# Patient Record
Sex: Female | Born: 1995 | Hispanic: Yes | Marital: Single | Smoking: Current every day smoker
Health system: Southern US, Community
[De-identification: ages and names within clinical notes are randomized; demographics above are authoritative.]

## PROBLEM LIST (undated history)

## (undated) DIAGNOSIS — I10 Essential (primary) hypertension: Secondary | ICD-10-CM

---

## 2016-05-11 ENCOUNTER — Emergency Department (HOSPITAL_COMMUNITY)
Admission: EM | Admit: 2016-05-11 | Discharge: 2016-05-11 | Disposition: A | Discharge status: Home / Self Care | Attending: Dermatology | Admitting: Dermatology

## 2016-05-11 ENCOUNTER — Encounter (HOSPITAL_COMMUNITY): Payer: Self-pay | Admitting: Emergency Medicine

## 2016-05-11 DIAGNOSIS — Z5321 Procedure and treatment not carried out due to patient leaving prior to being seen by health care provider: Secondary | ICD-10-CM | POA: Diagnosis not present

## 2016-05-11 DIAGNOSIS — I10 Essential (primary) hypertension: Secondary | ICD-10-CM | POA: Diagnosis present

## 2016-05-11 HISTORY — DX: Essential (primary) hypertension: I10

## 2016-05-11 NOTE — ED Triage Notes (Addendum)
Per verbalizes flu like symptoms onset 4 days ago; Theraflu taken this morning. Pt continues to verbalizes worsening hypertension. Pt verbalizes hx of hypertension and has not taken medications for such in some time. Neuro unremarkable.

## 2016-05-11 NOTE — ED Notes (Signed)
Patient handed registration pt labels and notified she was leaving without being seen.

## 2016-05-12 ENCOUNTER — Emergency Department (HOSPITAL_COMMUNITY)
Admission: EM | Admit: 2016-05-12 | Discharge: 2016-05-12 | Disposition: A | Discharge status: Home / Self Care | Attending: Emergency Medicine | Admitting: Emergency Medicine

## 2016-05-12 ENCOUNTER — Emergency Department (HOSPITAL_COMMUNITY)

## 2016-05-12 ENCOUNTER — Encounter (HOSPITAL_COMMUNITY): Payer: Self-pay | Admitting: Emergency Medicine

## 2016-05-12 DIAGNOSIS — I10 Essential (primary) hypertension: Secondary | ICD-10-CM | POA: Diagnosis not present

## 2016-05-12 DIAGNOSIS — R079 Chest pain, unspecified: Secondary | ICD-10-CM | POA: Insufficient documentation

## 2016-05-12 DIAGNOSIS — F17210 Nicotine dependence, cigarettes, uncomplicated: Secondary | ICD-10-CM | POA: Insufficient documentation

## 2016-05-12 DIAGNOSIS — Z79899 Other long term (current) drug therapy: Secondary | ICD-10-CM | POA: Insufficient documentation

## 2016-05-12 DIAGNOSIS — R51 Headache: Secondary | ICD-10-CM | POA: Diagnosis present

## 2016-05-12 DIAGNOSIS — G43809 Other migraine, not intractable, without status migrainosus: Secondary | ICD-10-CM | POA: Insufficient documentation

## 2016-05-12 LAB — CBC
HEMATOCRIT: 43.5 % (ref 36.0–46.0)
Hemoglobin: 14.6 g/dL (ref 12.0–15.0)
MCH: 27.5 pg (ref 26.0–34.0)
MCHC: 33.6 g/dL (ref 30.0–36.0)
MCV: 81.9 fL (ref 78.0–100.0)
PLATELETS: 290 10*3/uL (ref 150–400)
RBC: 5.31 MIL/uL — AB (ref 3.87–5.11)
RDW: 14 % (ref 11.5–15.5)
WBC: 9.3 10*3/uL (ref 4.0–10.5)

## 2016-05-12 LAB — BASIC METABOLIC PANEL
Anion gap: 13 (ref 5–15)
BUN: 10 mg/dL (ref 6–20)
CHLORIDE: 101 mmol/L (ref 101–111)
CO2: 23 mmol/L (ref 22–32)
Calcium: 9.7 mg/dL (ref 8.9–10.3)
Creatinine, Ser: 0.73 mg/dL (ref 0.44–1.00)
Glucose, Bld: 91 mg/dL (ref 65–99)
POTASSIUM: 3.9 mmol/L (ref 3.5–5.1)
SODIUM: 137 mmol/L (ref 135–145)

## 2016-05-12 LAB — I-STAT TROPONIN, ED
TROPONIN I, POC: 0 ng/mL (ref 0.00–0.08)
Troponin i, poc: 0.01 ng/mL (ref 0.00–0.08)

## 2016-05-12 MED ORDER — DEXAMETHASONE SODIUM PHOSPHATE 10 MG/ML IJ SOLN
10.0000 mg | Freq: Once | INTRAMUSCULAR | Status: AC
  Administered 2016-05-12: 10 mg via INTRAVENOUS
  Filled 2016-05-12: qty 1

## 2016-05-12 MED ORDER — DIPHENHYDRAMINE HCL 50 MG/ML IJ SOLN
25.0000 mg | Freq: Once | INTRAMUSCULAR | Status: AC
  Administered 2016-05-12: 25 mg via INTRAVENOUS
  Filled 2016-05-12: qty 1

## 2016-05-12 MED ORDER — AMLODIPINE BESYLATE 10 MG PO TABS: 10.0000 mg | ORAL_TABLET | Freq: Every day | ORAL | 0 refills | Status: AC

## 2016-05-12 MED ORDER — METOCLOPRAMIDE HCL 5 MG/ML IJ SOLN
10.0000 mg | Freq: Once | INTRAMUSCULAR | Status: AC
  Administered 2016-05-12: 10 mg via INTRAVENOUS
  Filled 2016-05-12: qty 2

## 2016-05-12 MED ORDER — AMLODIPINE BESYLATE 5 MG PO TABS
10.0000 mg | ORAL_TABLET | Freq: Once | ORAL | Status: AC
  Administered 2016-05-12: 10 mg via ORAL
  Filled 2016-05-12: qty 2

## 2016-05-12 NOTE — ED Triage Notes (Signed)
Pt reports hypertensive crisis, Hx HTN, no meds at presents due to lack of PCP. Alert and oriented x4. Also reports chest pain radiating to left arm and neck, tingling to left fingers.

## 2016-05-12 NOTE — ED Notes (Signed)
Patient is A & O x4.  Patient understood discharge instructions. 

## 2016-05-12 NOTE — ED Provider Notes (Signed)
[facility]-EMERGENCY DEPT Provider Note   CSN: [id number] Arrival date & time: 05/12/16  4098     History   Chief Complaint Chief Complaint  Patient presents with  . Chest Pain    HPI Tracy French is a 21 y.o. female.  The history is provided by the patient.  Chest Pain   Associated symptoms include headaches and nausea. Pertinent negatives include no fever and no vomiting.  Headache   This is a new problem. The current episode started 6 to 12 hours ago. The problem occurs constantly. The problem has not changed since onset.The headache is associated with nothing. The pain is located in the frontal region. The quality of the pain is described as dull. The pain is moderate. The pain does not radiate. Associated symptoms include nausea. Pertinent negatives include no fever and no vomiting. She has tried nothing for the symptoms. The treatment provided no relief.    Past Medical History:  Diagnosis Date  . Hypertension     There are no active problems to display for this patient.   History reviewed. No pertinent surgical history.  OB History    No data available       Home Medications    Prior to Admission medications   Medication Sig Start Date End Date Taking? Authorizing Provider  Phenylephrine-DM (THERAFLU COLD/COUGH DAYTIME) 10-20 MG STRP Take 1 tablet by mouth daily as needed (cold).   Yes Historical Provider, MD    Family History No family history on file.  Social History Social History  Substance Use Topics  . Smoking status: Current Every Day Smoker    Packs/day: 0.25    Types: Cigarettes  . Smokeless tobacco: Not on file  . Alcohol use Yes     Allergies   Patient has no known allergies.   Review of Systems Review of Systems  Constitutional: Negative for fever.  Cardiovascular: Positive for chest pain.  Gastrointestinal: Positive for nausea. Negative for vomiting.  Neurological: Positive for headaches.  All other systems reviewed and are  negative.    Physical Exam Updated Vital Signs BP (!) 160/129   Pulse 114   Temp 97.8 F (36.6 C) (Oral)   Resp 18   LMP 05/03/2016   SpO2 100%   Physical Exam  Constitutional: She is oriented to person, place, and time. She appears well-developed and well-nourished. No distress.  HENT:  Head: Normocephalic.  Nose: Nose normal.  Eyes: Conjunctivae are normal.  Neck: Neck supple. No tracheal deviation present.  Cardiovascular: Normal rate, regular rhythm and normal heart sounds.   Pulmonary/Chest: Effort normal and breath sounds normal. No respiratory distress.  Abdominal: Soft. She exhibits no distension. There is no tenderness.  Neurological: She is alert and oriented to person, place, and time.  Skin: Skin is warm and dry. Capillary refill takes less than 2 seconds.  Psychiatric: She has a normal mood and affect.  Vitals reviewed.    ED Treatments / Results  Labs (all labs ordered are listed, but only abnormal results are displayed) Labs Reviewed  CBC - Abnormal; Notable for the following:       Result Value   RBC 5.31 (*)    All other components within normal limits  BASIC METABOLIC PANEL  I-STAT TROPOININ, ED    EKG  EKG Interpretation  Date/Time:  Tuesday May 12 2016 08:57:14 EST Ventricular Rate:  92 PR Interval:    QRS Duration: 71 QT Interval:  473 QTC Calculation: 586 R Axis:   70 Text  Interpretation:  Sinus rhythm Probable left atrial enlargement Nonspecific T abnormalities, lateral leads Baseline wander in lead(s) V2 No previous tracing Confirmed by Harryette Shuart MD, Lafreda Casebeer [phone]([id number]) on 05/12/2016 9:23:30 AM       Radiology Dg Chest 2 View  Result Date: 05/12/2016 CLINICAL DATA:  21 year old female with flu-like symptoms for 4 days. History of hypertension. Initial encounter. EXAM: CHEST  2 VIEW COMPARISON:  None. FINDINGS: No infiltrate, congestive heart failure or pneumothorax. Heart size within normal limits. Question mild ectasia ascending thoracic  aorta. No acute osseous abnormality. IMPRESSION: No infiltrate, congestive heart failure or pneumothorax. Question mild ectasia ascending thoracic aorta. Electronically Signed   By: Lacy DuverneySteven  Olson M.D.   On: 05/12/2016 09:13    Procedures Procedures (including critical care time)  Medications Ordered in ED Medications - No data to display   Initial Impression / Assessment and Plan / ED Course  I have reviewed the triage vital signs and the nursing notes.  Pertinent labs & imaging results that were available during my care of the patient were reviewed by me and considered in my medical decision making (see chart for details).     21 y.o. female presents with concern that her bood pressure is running high. She was on 3 agent BP medication previously with amlodipine, metoprolol and HCTZ but was lost to follow up. She decided to take her BP after she had been taking theraflu for upper respiratory congestion. Her BP numbers made her very anxious at which time she noticed left sided chest pressure with tingling. Has nonspecific EKG abnormality likely related to poorly controlled hypertension. Troponin negative x2. Low risk by heart score and no signs of hypertensive crisis. Also c/o migraine starting this morning that is typical of prior. This is likely driving her BP up further.   Migraine cockatil with good relief of symptoms. Discussed restarting amlodipine and following up to add agents as necessary for poorly controlled HTN to safely and slowly correct. Plan to follow up with PCP as needed and return precautions discussed for worsening or new concerning symptoms.   Final Clinical Impressions(s) / ED Diagnoses   Final diagnoses:  Other migraine without status migrainosus, not intractable  Chronic hypertension  Nonspecific chest pain    New Prescriptions New Prescriptions   No medications on file     Lyndal Pulleyaniel Swathi Dauphin, MD 05/12/16 2143

## 2016-12-30 ENCOUNTER — Other Ambulatory Visit (HOSPITAL_COMMUNITY)
Admission: RE | Admit: 2016-12-30 | Discharge: 2016-12-30 | Disposition: A | Discharge status: Home / Self Care | Source: Ambulatory Visit | Attending: Physician Assistant | Admitting: Physician Assistant

## 2016-12-30 ENCOUNTER — Other Ambulatory Visit: Payer: Self-pay | Admitting: Physician Assistant

## 2016-12-30 DIAGNOSIS — Z0000 Encounter for general adult medical examination without abnormal findings: Secondary | ICD-10-CM | POA: Insufficient documentation

## 2016-12-31 LAB — CYTOLOGY - PAP
BACTERIAL VAGINITIS: POSITIVE — AB
Candida vaginitis: NEGATIVE
Chlamydia: NEGATIVE
DIAGNOSIS: NEGATIVE
Neisseria Gonorrhea: NEGATIVE

## 2018-05-21 IMAGING — CR DG CHEST 2V
2 series · 2 of 2 positions shown · non-contrast
Comparison: None.

CLINICAL DATA: 20-year-old female with flu-like symptoms for 4
days. History of hypertension. Initial encounter.

EXAM:
CHEST  2 VIEW

[w chest pa]
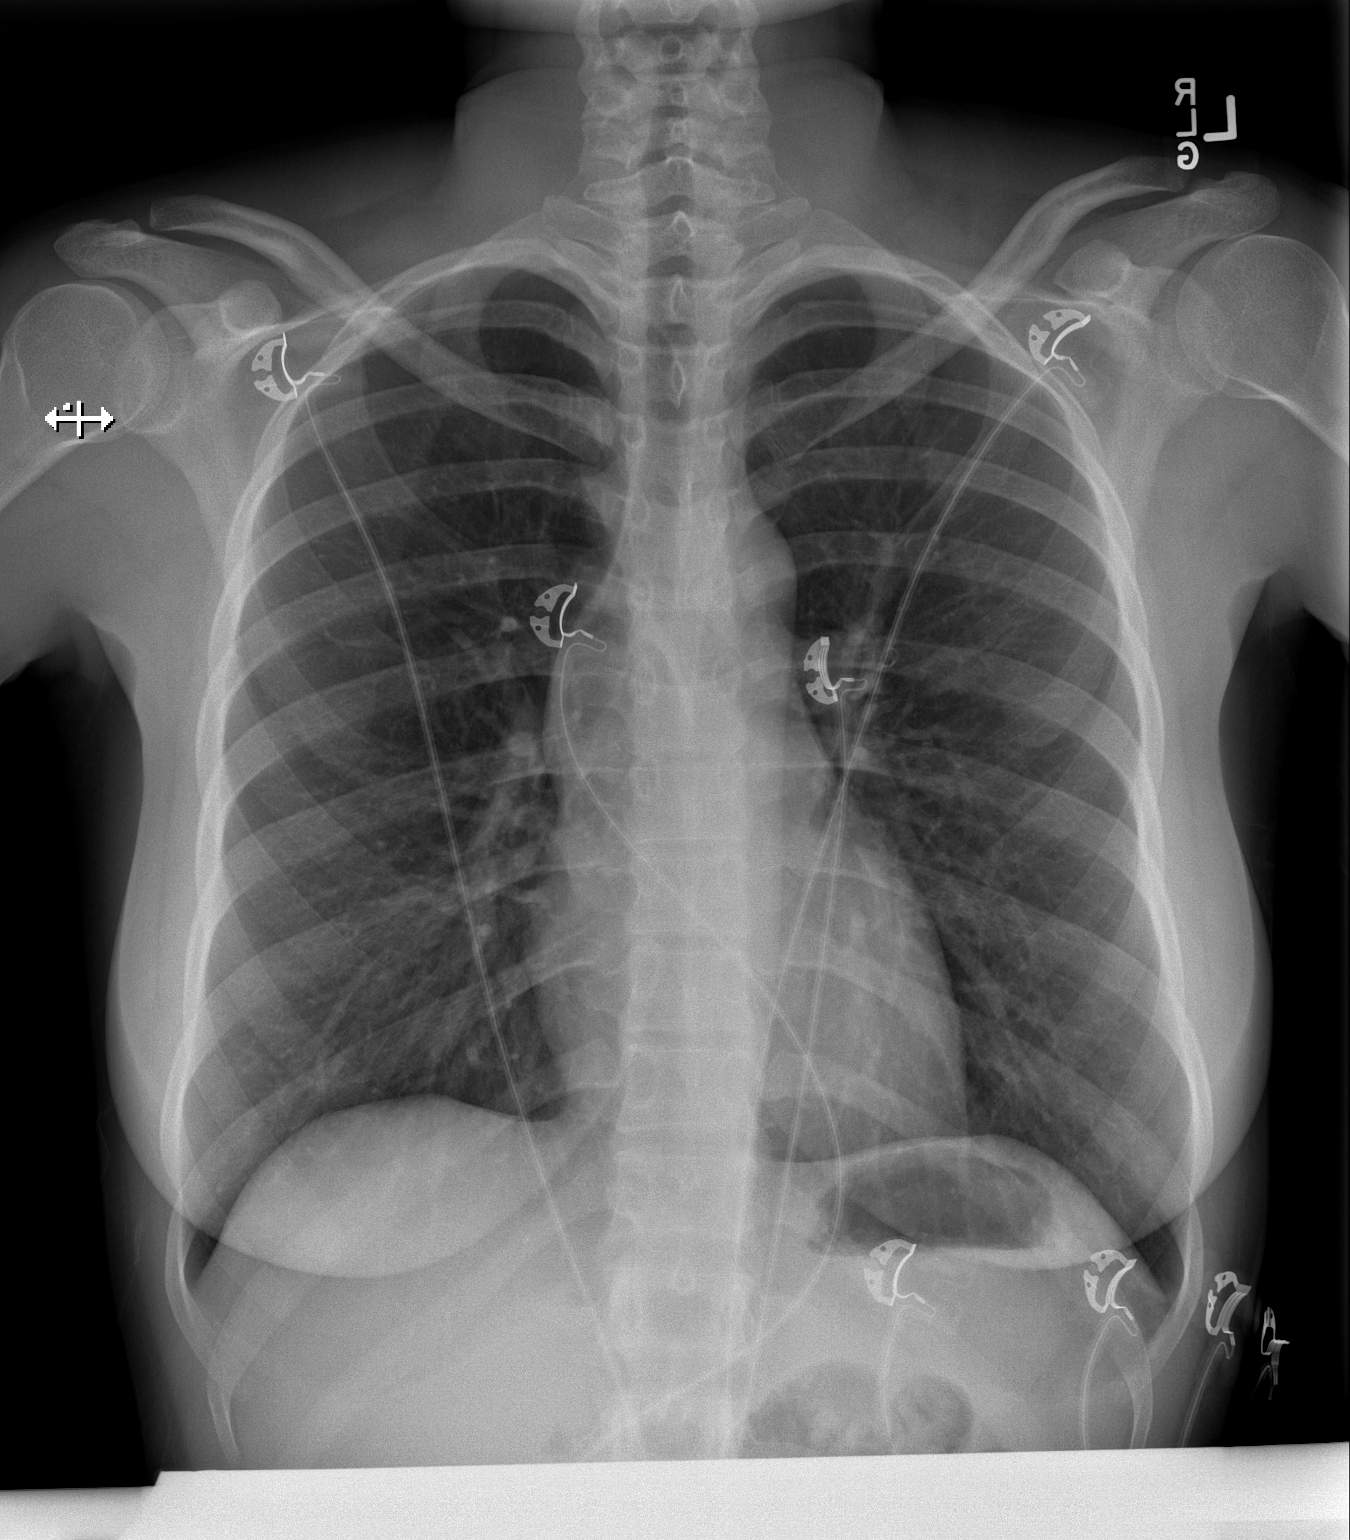

[w chest lat]
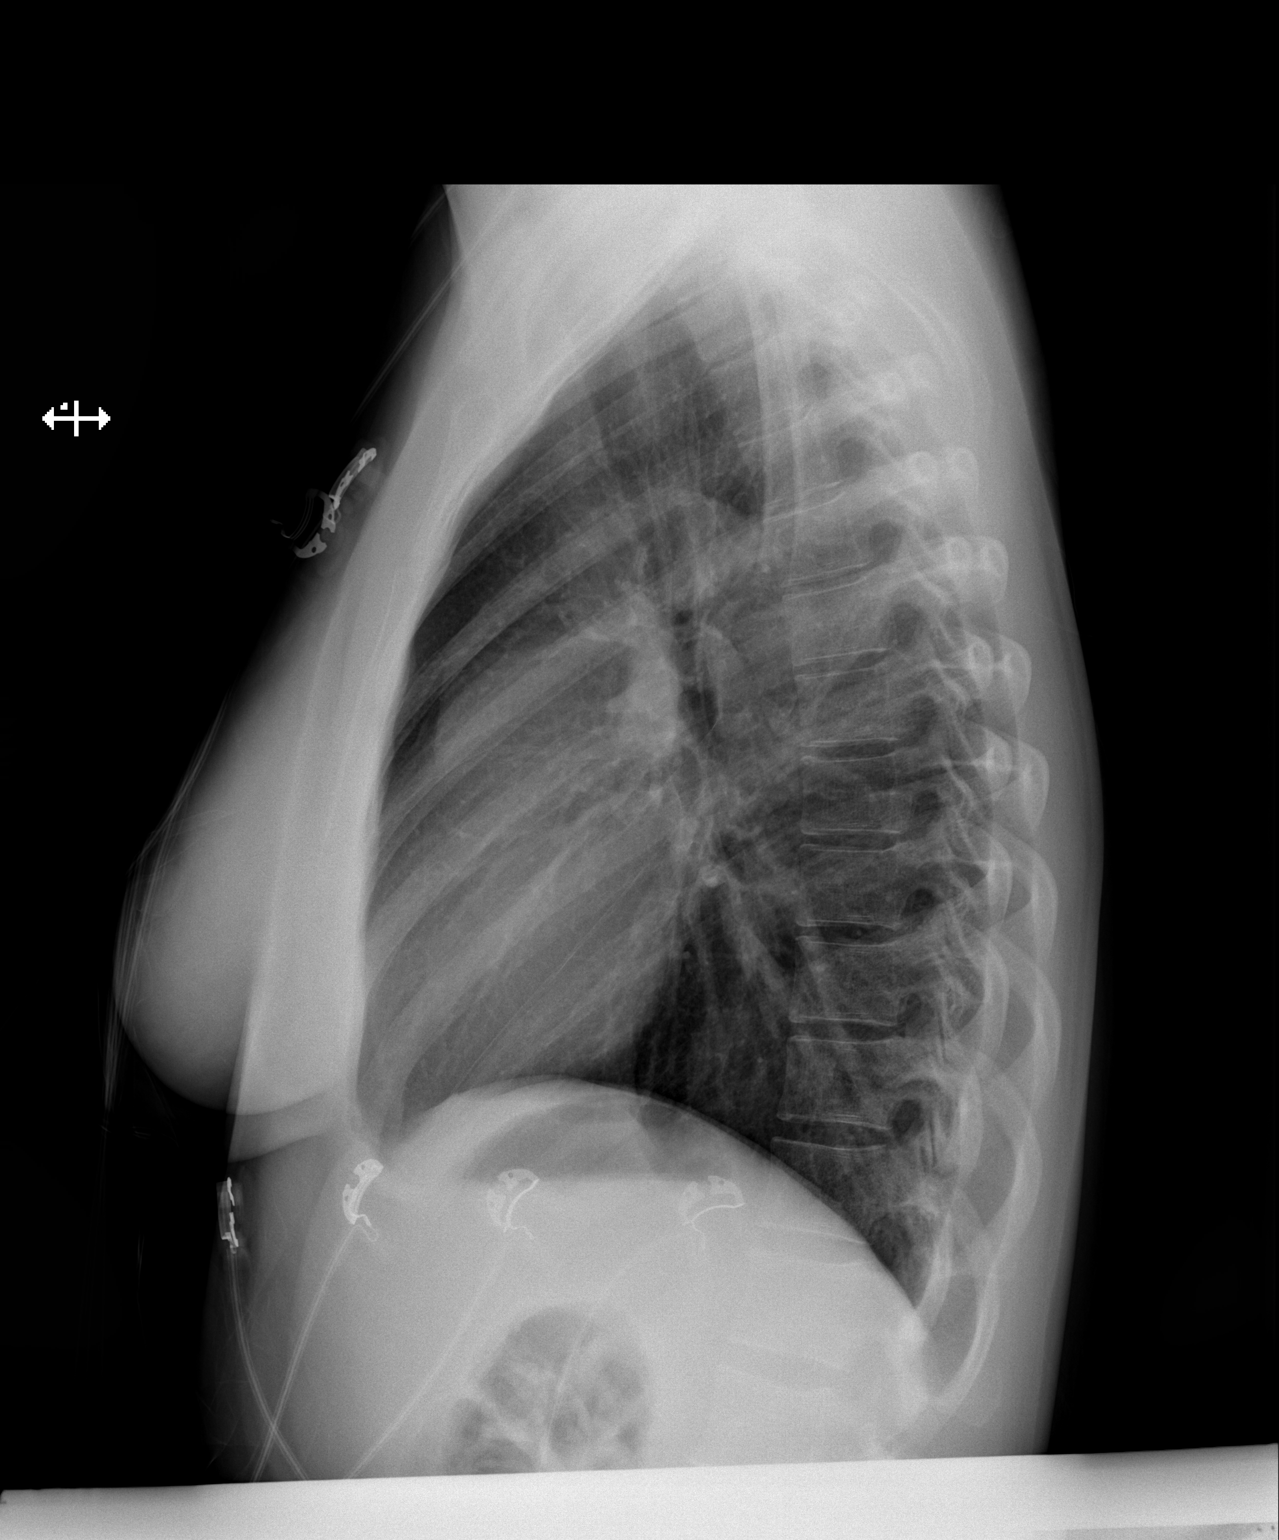

[2 of 2 positions shown; findings below may reference images not displayed]

FINDINGS: No infiltrate, congestive heart failure or pneumothorax.

Heart size within normal limits.

Question mild ectasia ascending thoracic aorta.

No acute osseous abnormality.
IMPRESSION: No infiltrate, congestive heart failure or pneumothorax.

Question mild ectasia ascending thoracic aorta.
